# Patient Record
Sex: Male | Born: 1970 | Hispanic: Yes | Marital: Single | Smoking: Never smoker
Health system: Southern US, Community
[De-identification: ages and names within clinical notes are randomized; demographics above are authoritative.]

## PROBLEM LIST (undated history)

## (undated) DIAGNOSIS — M1990 Unspecified osteoarthritis, unspecified site: Secondary | ICD-10-CM

## (undated) DIAGNOSIS — Z201 Contact with and (suspected) exposure to tuberculosis: Secondary | ICD-10-CM

## (undated) HISTORY — DX: Unspecified osteoarthritis, unspecified site: M19.90

## (undated) HISTORY — DX: Contact with and (suspected) exposure to tuberculosis: Z20.1

---

## 1992-09-06 DIAGNOSIS — Z201 Contact with and (suspected) exposure to tuberculosis: Secondary | ICD-10-CM

## 1992-09-06 HISTORY — DX: Contact with and (suspected) exposure to tuberculosis: Z20.1

## 2007-09-07 HISTORY — PX: OTHER SURGICAL HISTORY: SHX169

## 2015-11-25 DIAGNOSIS — G8929 Other chronic pain: Secondary | ICD-10-CM | POA: Insufficient documentation

## 2018-02-03 ENCOUNTER — Other Ambulatory Visit: Payer: Self-pay | Admitting: Family Medicine

## 2018-02-03 DIAGNOSIS — R1011 Right upper quadrant pain: Secondary | ICD-10-CM

## 2018-02-03 DIAGNOSIS — Z0000 Encounter for general adult medical examination without abnormal findings: Secondary | ICD-10-CM

## 2018-02-07 ENCOUNTER — Ambulatory Visit
Admission: RE | Admit: 2018-02-07 | Discharge: 2018-02-07 | Disposition: A | Discharge status: Home / Self Care | Payer: BLUE CROSS/BLUE SHIELD | Source: Ambulatory Visit | Attending: Family Medicine | Admitting: Family Medicine

## 2018-02-07 DIAGNOSIS — Z0000 Encounter for general adult medical examination without abnormal findings: Secondary | ICD-10-CM | POA: Diagnosis not present

## 2018-02-07 DIAGNOSIS — R1011 Right upper quadrant pain: Secondary | ICD-10-CM | POA: Diagnosis present

## 2018-02-15 ENCOUNTER — Other Ambulatory Visit: Payer: Self-pay | Admitting: Family Medicine

## 2018-02-15 DIAGNOSIS — N029 Recurrent and persistent hematuria with unspecified morphologic changes: Secondary | ICD-10-CM

## 2018-02-17 ENCOUNTER — Ambulatory Visit
Admission: RE | Admit: 2018-02-17 | Discharge: 2018-02-17 | Disposition: A | Discharge status: Home / Self Care | Payer: BLUE CROSS/BLUE SHIELD | Source: Ambulatory Visit | Attending: Family Medicine | Admitting: Family Medicine

## 2018-02-17 DIAGNOSIS — N029 Recurrent and persistent hematuria with unspecified morphologic changes: Secondary | ICD-10-CM | POA: Diagnosis not present

## 2018-05-12 ENCOUNTER — Ambulatory Visit: Payer: Self-pay | Admitting: Urology

## 2018-06-07 ENCOUNTER — Ambulatory Visit: Payer: Self-pay | Admitting: Urology

## 2018-06-09 ENCOUNTER — Encounter: Payer: Self-pay | Admitting: Urology

## 2018-06-09 ENCOUNTER — Ambulatory Visit: Payer: BLUE CROSS/BLUE SHIELD | Admitting: Urology

## 2018-06-09 VITALS — BP 129/80 | Ht 66.0 in | Wt 187.8 lb

## 2018-06-09 DIAGNOSIS — R3129 Other microscopic hematuria: Secondary | ICD-10-CM

## 2018-06-09 LAB — URINALYSIS, COMPLETE
BILIRUBIN UA: NEGATIVE
GLUCOSE, UA: NEGATIVE
KETONES UA: NEGATIVE
Leukocytes, UA: NEGATIVE
NITRITE UA: NEGATIVE
Protein, UA: NEGATIVE
SPEC GRAV UA: 1.015 (ref 1.005–1.030)
Urobilinogen, Ur: 0.2 mg/dL (ref 0.2–1.0)
pH, UA: 6 (ref 5.0–7.5)

## 2018-06-09 LAB — MICROSCOPIC EXAMINATION
Bacteria, UA: NONE SEEN
Epithelial Cells (non renal): NONE SEEN /hpf (ref 0–10)
WBC UA: NONE SEEN /HPF (ref 0–5)

## 2018-06-09 NOTE — Progress Notes (Signed)
   06/09/2018 4:44 PM   Micheal Miller 1970-12-07 [id number]  Referring provider: Dione Housekeeper, MD [address] [place], [place] [address]  CC: Right flank pain, microscopic hematuria  HPI: I had the pleasure of seeing [name] in urology clinic today in consultation for microscopic hematuria from Dr. Zada Finders.  He is a 47 year old male with a history of a prior negative hematuria work-up with cystoscopy and CT urogram approximately 5 years prior.  He reports approximately 2 years of mild 1-3/10 right-sided flank pain.  He feels that this is worse when he is either driving or drinking water.  He denies any gross hematuria.  He is a non-smoker.  No family history of bladder cancer.  He is otherwise very healthy.   PMH: Past Medical History:  Diagnosis Date  . Arthritis   . TB (tuberculosis) contact 1994    Surgical History: Arm surgery  Social History:  reports that he has never smoked. He does not have any smokeless tobacco history on file. He reports that he drinks about 6.0 standard drinks of alcohol per week. He reports that he does not use drugs.  ROS: Please see flowsheet from today's date for complete review of systems.  Physical Exam: BP 129/80 (BP Location: Left Arm, Patient Position: Sitting, Cuff Size: Normal)   Ht 5\' 6"  (1.676 m)   Wt 187 lb 12.8 oz (85.2 kg)   BMI 30.31 kg/m    Constitutional:  Alert and oriented, No acute distress. Cardiovascular: No clubbing, cyanosis, or edema. Respiratory: Normal respiratory effort, no increased work of breathing. GI: Abdomen is soft, nontender, nondistended, no abdominal masses GU: No CVA tenderness Lymph: No cervical or inguinal lymphadenopathy. Skin: No rashes, bruises or suspicious lesions. Neurologic: Grossly intact, no focal deficits, moving all 4 extremities. Psychiatric: Normal mood and affect.  Laboratory Data: Creatinine 1.0  Urinalysis today 0 WBCs, 3-10 RBCs, nitrite negative but no  bacteria  Pertinent Imaging: I have personally reviewed the CT abdomen pelvis without contrast dated 02/17/2018: There is no hydronephrosis or urolithiasis.  Assessment & Plan:   In summary, [name] is a 47 year old healthy male with approximately 2 years of mild right-sided flank pain that he associates with drinking water.  He has a normal CT scan with no hydronephrosis from June 2019.  He also has microscopic hematuria, which is confirmed today.  We discussed possible etiologies of his pain including Dietl's crisis with an underlying UPJ obstruction.    We discussed common possible etiologies of hematuria including BPH, malignancy, urolithiasis, medical renal disease, and idiopathic. Standard workup recommended by the [facility] includes imaging with CT urogram to assess the upper tracts, and cystoscopy. Cytology is performed on patient's with gross hematuria to look for malignant cells in the urine.  RTC for CT urogram, recommend aggressive hydration prior to scan to try and clarify if this is related to hydration with Dietl's crisis. He agreed to defer cystoscopy at this time with his history of negative cystoscopy and lack of risk factors for bladder cancer.  Sondra Come, MD  [facility] [address] [place], [place] [address] [phone]

## 2018-06-28 ENCOUNTER — Ambulatory Visit
Admission: RE | Admit: 2018-06-28 | Discharge: 2018-06-28 | Disposition: A | Discharge status: Home / Self Care | Payer: BLUE CROSS/BLUE SHIELD | Source: Ambulatory Visit | Attending: Urology | Admitting: Urology

## 2018-06-28 DIAGNOSIS — M899 Disorder of bone, unspecified: Secondary | ICD-10-CM | POA: Diagnosis not present

## 2018-06-28 DIAGNOSIS — R3129 Other microscopic hematuria: Secondary | ICD-10-CM

## 2018-06-28 MED ORDER — IOHEXOL 300 MG/ML  SOLN
150.0000 mL | Freq: Once | INTRAMUSCULAR | Status: AC | PRN
  Administered 2018-06-28: 125 mL via INTRAVENOUS

## 2018-07-03 ENCOUNTER — Telehealth: Payer: Self-pay

## 2018-07-03 NOTE — Telephone Encounter (Signed)
-----   Message from Sondra Come, MD sent at 07/03/2018 11:40 AM EDT ----- Please let him know his CT urogram was completely normal. No stone, hydronephrosis, masses, or any obstruction seen. I do not think there is a urologic cause for his right sided flank pain. If his pain worsens, can obtain a renal ultrasound at time of his pain to look for hydronephrosis, but from the CT, I think it is unlikely to be urologic in nature. He can follow up as needed unless he wants another appointment with me to discuss further. Thanks  Legrand Rams, MD 07/03/2018

## 2018-07-03 NOTE — Telephone Encounter (Signed)
Called pt no answer. LM for pt informing him of normal results. Advised pt to call back for questions or concerns.

## 2019-10-15 DIAGNOSIS — N3021 Other chronic cystitis with hematuria: Secondary | ICD-10-CM | POA: Insufficient documentation

## 2019-10-15 DIAGNOSIS — E7800 Pure hypercholesterolemia, unspecified: Secondary | ICD-10-CM | POA: Insufficient documentation

## 2019-10-17 DIAGNOSIS — E538 Deficiency of other specified B group vitamins: Secondary | ICD-10-CM | POA: Insufficient documentation

## 2019-10-17 DIAGNOSIS — E559 Vitamin D deficiency, unspecified: Secondary | ICD-10-CM | POA: Insufficient documentation

## 2019-10-22 ENCOUNTER — Other Ambulatory Visit: Payer: Self-pay | Admitting: Family Medicine

## 2019-10-22 DIAGNOSIS — R3129 Other microscopic hematuria: Secondary | ICD-10-CM

## 2019-10-29 ENCOUNTER — Other Ambulatory Visit
Admission: RE | Admit: 2019-10-29 | Discharge: 2019-10-29 | Disposition: A | Discharge status: Home / Self Care | Payer: BC Managed Care – PPO | Attending: Urology | Admitting: Urology

## 2019-10-29 ENCOUNTER — Encounter: Payer: Self-pay | Admitting: Urology

## 2019-10-29 ENCOUNTER — Ambulatory Visit (INDEPENDENT_AMBULATORY_CARE_PROVIDER_SITE_OTHER): Payer: BC Managed Care – PPO | Admitting: Urology

## 2019-10-29 ENCOUNTER — Other Ambulatory Visit: Payer: Self-pay

## 2019-10-29 VITALS — BP 130/88 | HR 79 | Ht 66.0 in | Wt 200.0 lb

## 2019-10-29 DIAGNOSIS — R109 Unspecified abdominal pain: Secondary | ICD-10-CM

## 2019-10-29 DIAGNOSIS — R3129 Other microscopic hematuria: Secondary | ICD-10-CM | POA: Insufficient documentation

## 2019-10-29 LAB — URINALYSIS, COMPLETE (UACMP) WITH MICROSCOPIC
Bacteria, UA: NONE SEEN
Bilirubin Urine: NEGATIVE
Glucose, UA: NEGATIVE mg/dL
Ketones, ur: NEGATIVE mg/dL
Leukocytes,Ua: NEGATIVE
Nitrite: NEGATIVE
Protein, ur: NEGATIVE mg/dL
Specific Gravity, Urine: 1.01 (ref 1.005–1.030)
Squamous Epithelial / LPF: NONE SEEN (ref 0–5)
WBC, UA: NONE SEEN WBC/hpf (ref 0–5)
pH: 5.5 (ref 5.0–8.0)

## 2019-10-29 NOTE — Progress Notes (Signed)
10/29/2019 2:03 PM   [place] [birth year] [id number]  Referring provider: Valera Miller, [address] [place],  [place] [address]  Chief Complaint  Patient presents with  . Hematuria    HPI: [name] is a 49 year old male with microscopic hematuria and chronic right sided pain who presents today for a second opinion regarding his symptoms.  He was seen by Dr. Diamantina Miller for similar complaints on 06/09/2018.  His UA was positive for 3-10 RBC's.  He underwent a CT Urogram on 06/28/2018 did not reveal an etiology for his microscopic hematuria or right-sided flank pain.  He decided against undergoing a cystoscopy at that time due to his history of negative cystoscopy and lack of risk factors for bladder cancer.  Today, he explains that approximately 10 years ago he was found to have microscopic hematuria and underwent cystoscopy which was negative.  5 years ago he started to develop right-sided pain located in the upper quadrant.  He states at that time he underwent an evaluation of  gallbladder, pancreas, appendix and kidneys and no cause was found for the pain.    He states the pain is worsened by drinking water and he does not note worsening of the pain with drinking other fluids.  He describes the pain has characteristic of a sore muscle.  He states the pain is worse in the morning, but it gets better throughout the day.  He denies ever being woken up due to pain.  The pain has not been associated with fever, chills, nausea or vomiting.  The pain is located in the right flank area radiating underneath the axilla and into the right upper quadrant.  He states the pain is constantly at a level of 1-2, but that times it becomes intense with a 6 out of 10.  His UA is yellow clear, specific gravity 1.010, pH 5.5, trace blood and 0-5 RBCs.    PMH: Past Medical History:  Diagnosis Date  . Arthritis   . TB (tuberculosis) contact 1994    Surgical History: Past Surgical  History:  Procedure Laterality Date  . open fracture arm  2009    Home Medications:  Allergies as of 10/29/2019   No Known Allergies     Medication List       Accurate as of October 29, 2019  2:03 PM. If you have any questions, ask your nurse or doctor.        atorvastatin 40 MG tablet Commonly known as: LIPITOR Take by mouth.   Cholecalciferol 50 MCG (2000 UT) Caps Take 3 capsules daily for 3 months, then reduce to 1 capsule daily thereafter for Vitamin D Deficiency.   cyanocobalamin 1000 MCG tablet Take 2 tablets daily for 2 weeks, then reduce to 1 tablet daily thereafter for Vitamin B12 Deficiency.       Allergies: No Known Allergies  Family History: History reviewed. No pertinent family history.  Social History:  reports that he has never smoked. He has never used smokeless tobacco. He reports current alcohol use of about 6.0 standard drinks of alcohol per week. He reports that he does not use drugs.  ROS: For pertinent review of systems please refer to history of present illness  Physical Exam: BP 130/88   Pulse 79   Ht 5\' 6"  (1.676 m)   Wt 200 lb (90.7 kg)   BMI 32.28 kg/m   Constitutional:  Well nourished. Alert and oriented, No acute distress. HEENT: [place] AT,  mask in place.  Trachea midline, no masses. Cardiovascular: No clubbing, cyanosis, or edema. Respiratory: Normal respiratory effort, no increased work of breathing. GI: Abdomen is soft, non tender, non distended, no abdominal masses. GU: No CVA tenderness.  No bladder fullness or masses.   Neurologic: Grossly intact, no focal deficits, moving all 4 extremities. Psychiatric: Normal mood and affect.  Laboratory Data: Urinalysis    Component Value Date/Time   COLORURINE YELLOW 10/29/2019 1300   APPEARANCEUR CLEAR 10/29/2019 1300   APPEARANCEUR Clear 06/09/2018 1604   LABSPEC 1.010 10/29/2019 1300   PHURINE 5.5 10/29/2019 1300   GLUCOSEU NEGATIVE 10/29/2019 1300   HGBUR TRACE (A) 10/29/2019 1300    BILIRUBINUR NEGATIVE 10/29/2019 1300   BILIRUBINUR Negative 06/09/2018 1604   KETONESUR NEGATIVE 10/29/2019 1300   PROTEINUR NEGATIVE 10/29/2019 1300   NITRITE NEGATIVE 10/29/2019 1300   LEUKOCYTESUR NEGATIVE 10/29/2019 1300    I have reviewed the labs.   Pertinent Imaging: No recent imaging  Assessment & Plan:    1. Right flank pain Doubtful the pain has a urological etiology as he has had a CT urogram which had no findings to explain the pain and today's UA is clear.  I expressed to him that his pain may be more arthritic in nature as he states the morning is when the pain is the worst and it improves during the day which can be seen in osteoarthritis.  I believe the worsening of the pain with drinking water is mostly coincidental and not an aggravating or modifying factor.  We did discuss undergoing a renal ultrasound, although I did express that will be likely of low yield as the CT urogram was negative just a few years ago.  He would like to have the renal ultrasound at this time to take another look at his kidneys.  If the renal ultrasound returned negative, I have suggested he contact his primary care physician, chiropractor or orthopedist to be evaluated for arthritis or other muscle skeletal disorders.    Return for I will call patient with results.  These notes generated with voice recognition software. I apologize for typographical errors.  Micheal Cowboy, PA-C  [facility] [address]  [address] [place], [place] [address] [phone]  A total of 30 minutes were spent face-to-face with the patient, greater than 50% was spent in patient education, counseling, and coordination of care regarding his right flank, possible causes and who to follow up with for further investigation into his pain.

## 2019-11-07 ENCOUNTER — Telehealth: Payer: Self-pay | Admitting: Family Medicine

## 2019-11-07 ENCOUNTER — Ambulatory Visit
Admission: RE | Admit: 2019-11-07 | Discharge: 2019-11-07 | Disposition: A | Discharge status: Home / Self Care | Payer: BC Managed Care – PPO | Source: Ambulatory Visit | Attending: Urology | Admitting: Urology

## 2019-11-07 ENCOUNTER — Other Ambulatory Visit: Payer: Self-pay

## 2019-11-07 DIAGNOSIS — R109 Unspecified abdominal pain: Secondary | ICD-10-CM | POA: Diagnosis not present

## 2019-11-07 NOTE — Telephone Encounter (Signed)
-----   Message from Harle Battiest, PA-C sent at 11/07/2019  3:13 PM EST ----- Please let [name] know that his ultrasound of his kidneys was normal.  I suggest following up with his PCP, chiropractor or orthopedist to be evaluated for arthritis.

## 2019-11-07 NOTE — Telephone Encounter (Signed)
LMOM for patient to return call.

## 2019-11-08 NOTE — Telephone Encounter (Signed)
Patient notified

## 2021-04-03 IMAGING — US US RENAL
1 series · 14 of 25 positions shown · non-contrast
Comparison: CT scan 06/28/2018

CLINICAL DATA: Chronic right flank pain

EXAM:
RENAL / URINARY TRACT ULTRASOUND COMPLETE

[Series 1: us renal · 0.25mm/px · 14 of 26 slices shown]
[im 1/26]
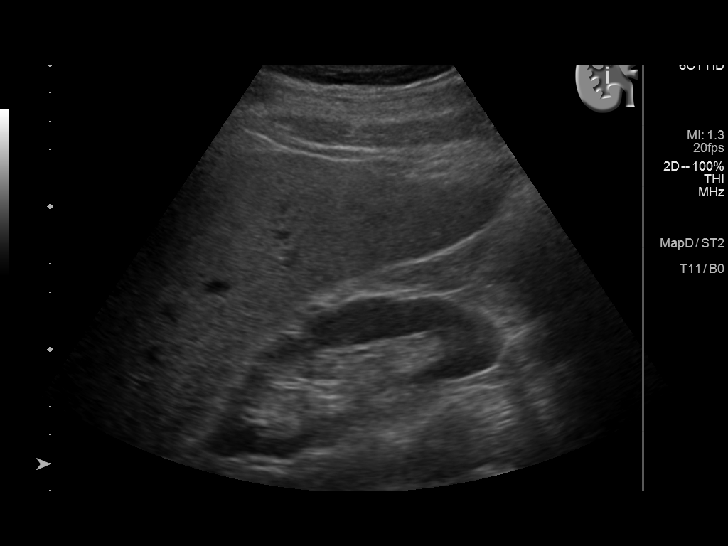
[im 3/26]
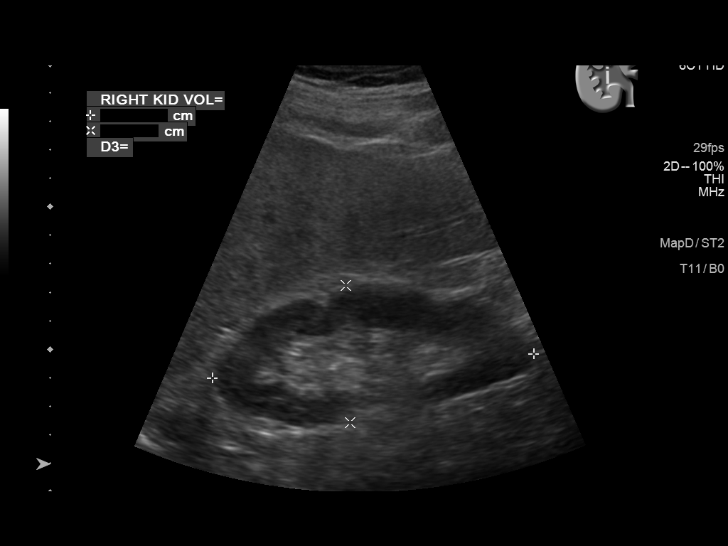
[im 5/26]
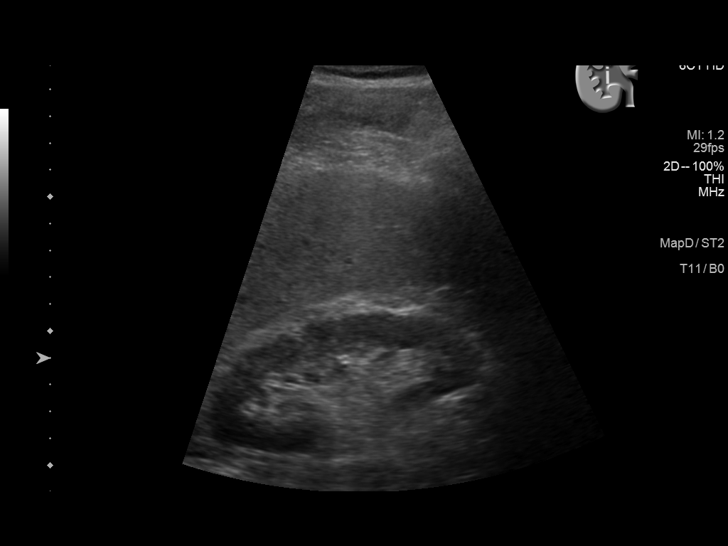
[im 7/26]
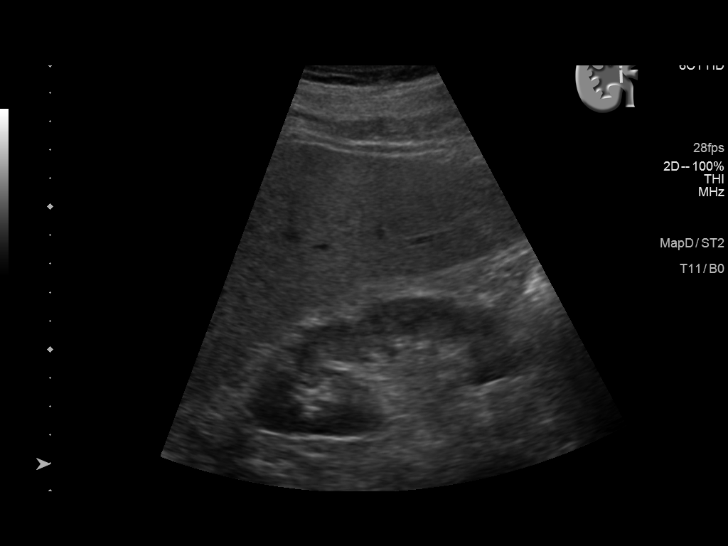
[im 9/26]
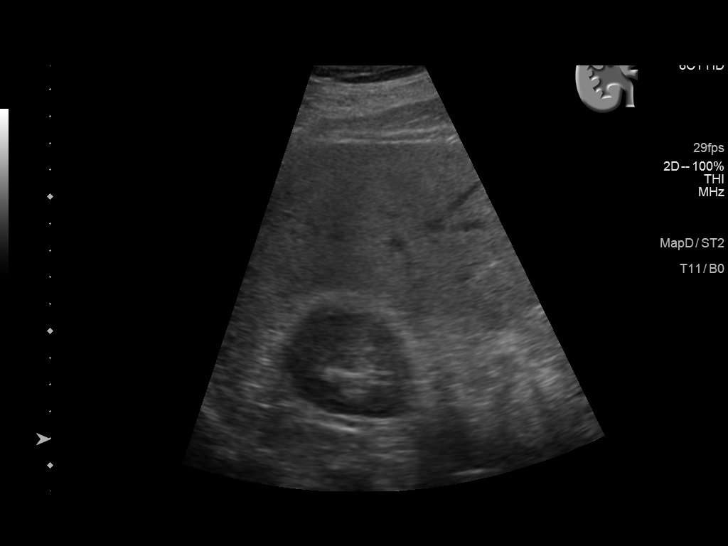
[im 10/26]
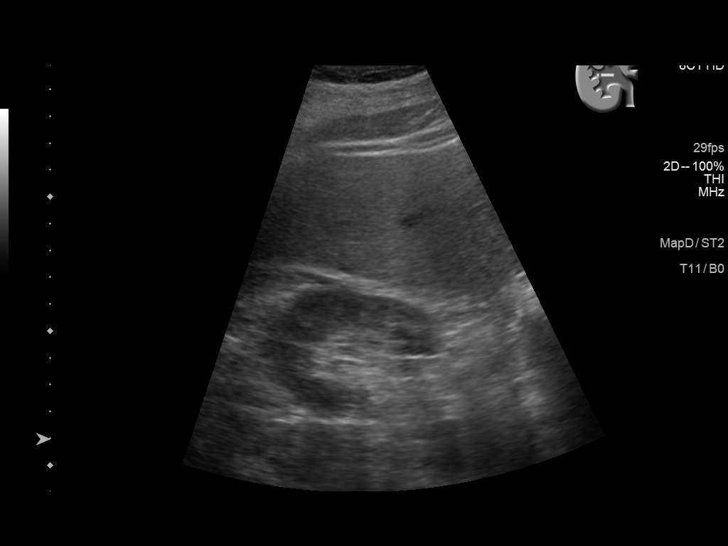
[im 12/26]
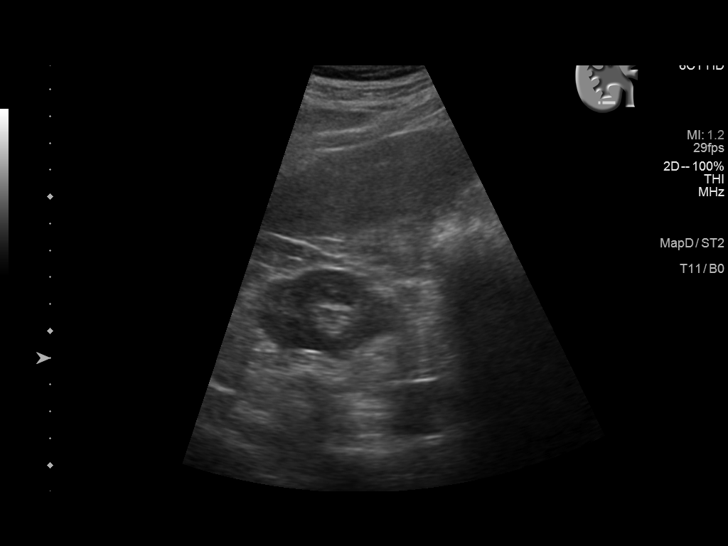
[im 14/26]
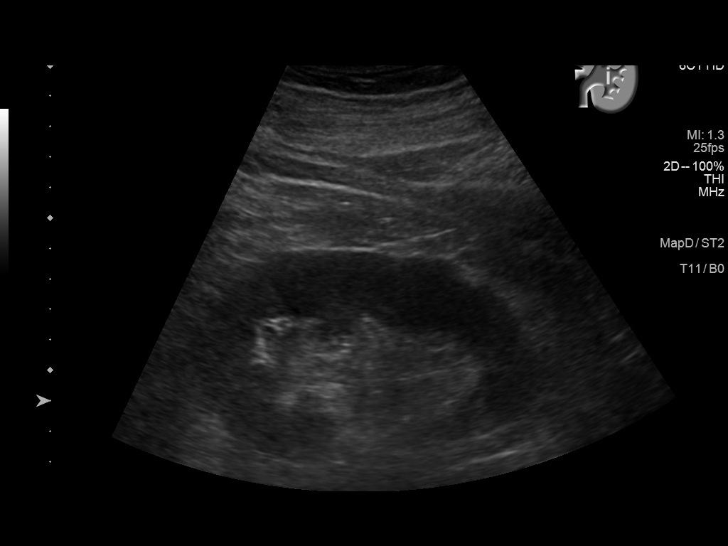
[im 16/26]
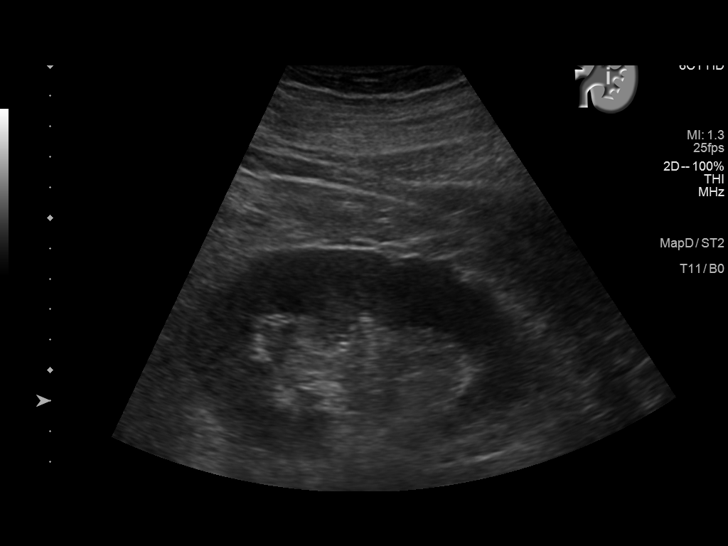
[im 17/26]
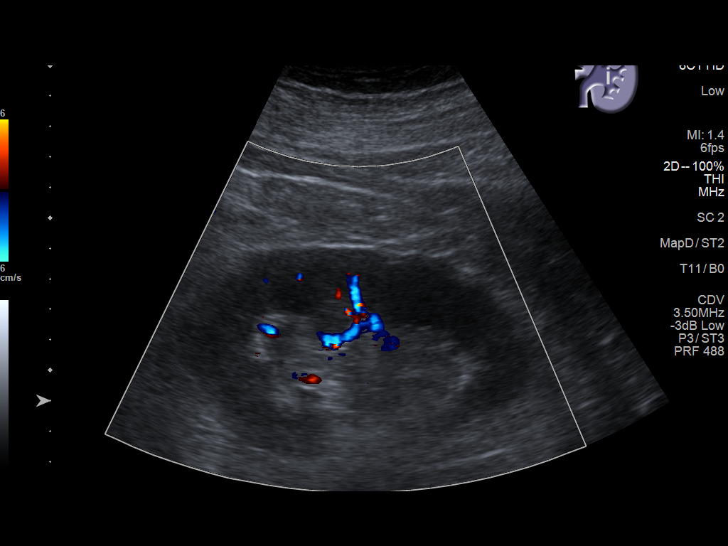
[im 19/26]
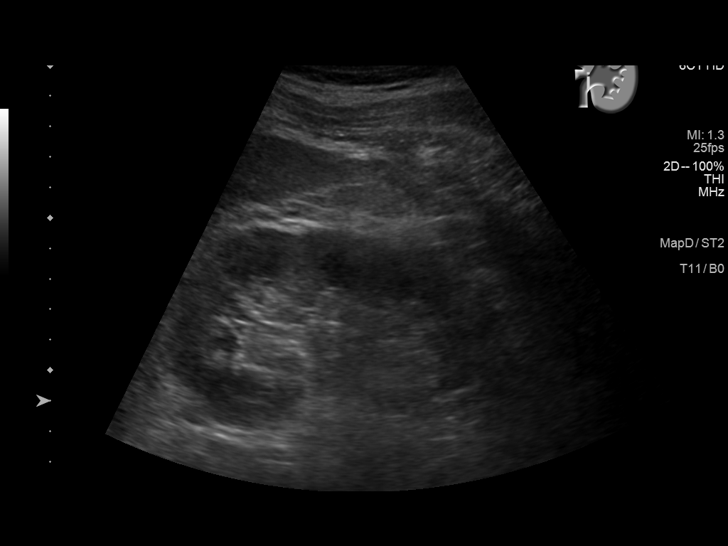
[im 21/26]
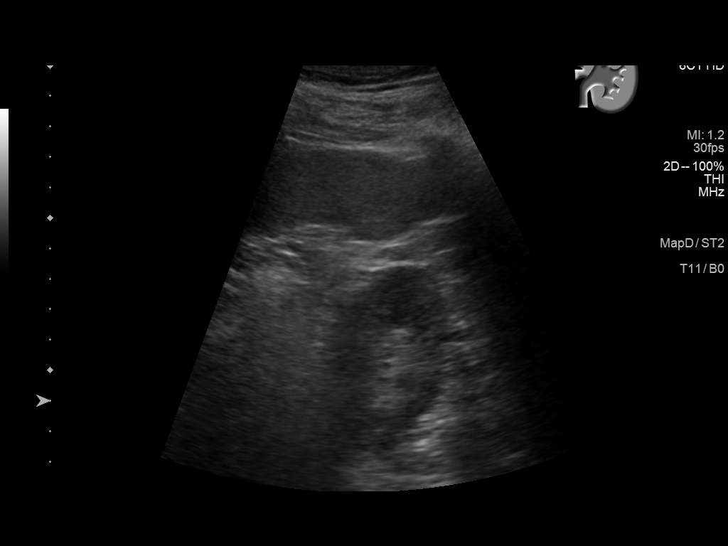
[im 23/26]
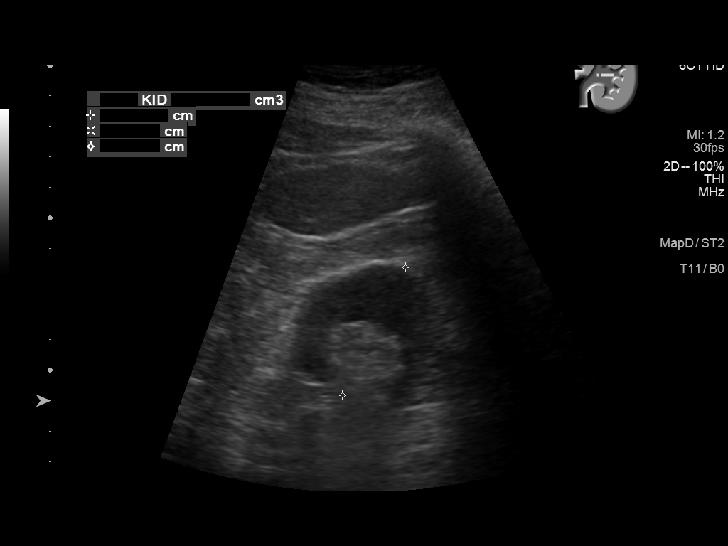
[im 26/26]
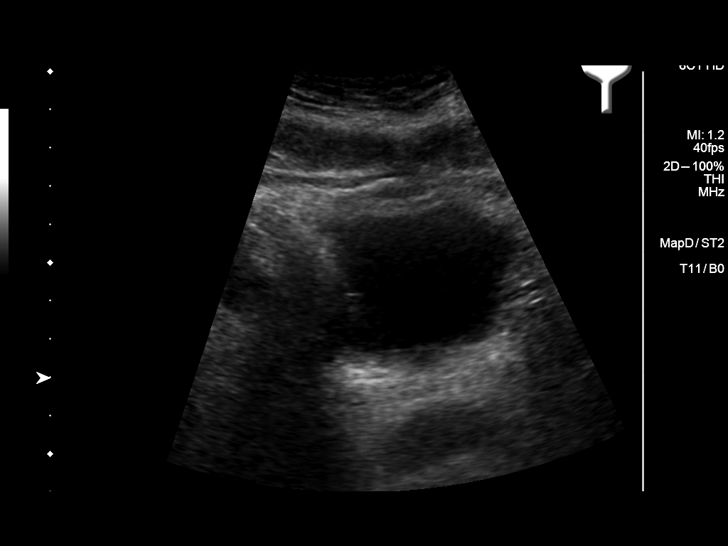

[14 of 25 positions shown; findings below may reference images not displayed]

FINDINGS: Right Kidney:

Renal measurements: 11.3 x 4.8 x 4.6 cm = volume: 131.4 mL . Normal
renal cortical thickness and echogenicity without focal lesions or
hydronephrosis. No definite renal calculi.

Left Kidney:

Renal measurements: 11.0 x 6.8 x 4.7 cm = volume: 184.6 mL. Normal
renal cortical thickness and echogenicity without focal lesions or
hydronephrosis. No definite renal calculi.

Bladder:

Appears normal for degree of bladder distention.

Other:

None.
IMPRESSION: Normal renal ultrasound examination.
